# Patient Record
Sex: Male | Born: 1960 | Race: White | Hispanic: No | Marital: Single | Smoking: Never smoker
Health system: Southern US, Community
[De-identification: ages and names within clinical notes are randomized; demographics above are authoritative.]

## PROBLEM LIST (undated history)

## (undated) DIAGNOSIS — J449 Chronic obstructive pulmonary disease, unspecified: Secondary | ICD-10-CM

## (undated) DIAGNOSIS — E119 Type 2 diabetes mellitus without complications: Secondary | ICD-10-CM

## (undated) HISTORY — PX: SHOULDER OPEN ROTATOR CUFF REPAIR: SHX2407

## (undated) HISTORY — PX: REPLACEMENT TOTAL KNEE: SUR1224

---

## 2015-10-09 ENCOUNTER — Emergency Department (HOSPITAL_BASED_OUTPATIENT_CLINIC_OR_DEPARTMENT_OTHER)
Admission: EM | Admit: 2015-10-09 | Discharge: 2015-10-09 | Disposition: A | Discharge status: Home / Self Care | Payer: Medicare Other | Attending: Emergency Medicine | Admitting: Emergency Medicine

## 2015-10-09 ENCOUNTER — Encounter (HOSPITAL_BASED_OUTPATIENT_CLINIC_OR_DEPARTMENT_OTHER): Payer: Self-pay | Admitting: *Deleted

## 2015-10-09 ENCOUNTER — Emergency Department (HOSPITAL_BASED_OUTPATIENT_CLINIC_OR_DEPARTMENT_OTHER): Payer: Medicare Other

## 2015-10-09 DIAGNOSIS — Y9389 Activity, other specified: Secondary | ICD-10-CM | POA: Insufficient documentation

## 2015-10-09 DIAGNOSIS — W3400XA Accidental discharge from unspecified firearms or gun, initial encounter: Secondary | ICD-10-CM | POA: Insufficient documentation

## 2015-10-09 DIAGNOSIS — S61209A Unspecified open wound of unspecified finger without damage to nail, initial encounter: Secondary | ICD-10-CM

## 2015-10-09 DIAGNOSIS — R0981 Nasal congestion: Secondary | ICD-10-CM | POA: Diagnosis present

## 2015-10-09 DIAGNOSIS — H9203 Otalgia, bilateral: Secondary | ICD-10-CM | POA: Diagnosis not present

## 2015-10-09 DIAGNOSIS — Y998 Other external cause status: Secondary | ICD-10-CM | POA: Diagnosis not present

## 2015-10-09 DIAGNOSIS — S61200A Unspecified open wound of right index finger without damage to nail, initial encounter: Secondary | ICD-10-CM | POA: Diagnosis not present

## 2015-10-09 DIAGNOSIS — E119 Type 2 diabetes mellitus without complications: Secondary | ICD-10-CM | POA: Insufficient documentation

## 2015-10-09 DIAGNOSIS — J069 Acute upper respiratory infection, unspecified: Secondary | ICD-10-CM | POA: Diagnosis not present

## 2015-10-09 DIAGNOSIS — Y9289 Other specified places as the place of occurrence of the external cause: Secondary | ICD-10-CM | POA: Insufficient documentation

## 2015-10-09 DIAGNOSIS — E669 Obesity, unspecified: Secondary | ICD-10-CM | POA: Insufficient documentation

## 2015-10-09 HISTORY — DX: Type 2 diabetes mellitus without complications: E11.9

## 2015-10-09 LAB — CBG MONITORING, ED: GLUCOSE-CAPILLARY: 178 mg/dL — AB (ref 65–99)

## 2015-10-09 NOTE — ED Notes (Signed)
Pt refused discharge vitals 

## 2015-10-09 NOTE — ED Notes (Signed)
Patient c/o of bilateral ear pain since yesterday & states he has been swimming for exercise. Also c/o R hand index finger pain resulting from wound on index finger that has not healed
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
## 2015-10-09 NOTE — ED Notes (Signed)
Patient upset that he did not get a prescription for pain medication and refused to let EMT take his BP. Offered referral to hand specialist but refused

## 2021-06-17 ENCOUNTER — Encounter (HOSPITAL_BASED_OUTPATIENT_CLINIC_OR_DEPARTMENT_OTHER): Payer: Self-pay | Admitting: Emergency Medicine

## 2021-06-17 ENCOUNTER — Other Ambulatory Visit: Payer: Self-pay

## 2021-06-17 ENCOUNTER — Emergency Department (HOSPITAL_BASED_OUTPATIENT_CLINIC_OR_DEPARTMENT_OTHER): Payer: Medicare HMO

## 2021-06-17 ENCOUNTER — Emergency Department (HOSPITAL_BASED_OUTPATIENT_CLINIC_OR_DEPARTMENT_OTHER)
Admission: EM | Admit: 2021-06-17 | Discharge: 2021-06-17 | Disposition: A | Discharge status: Home / Self Care | Payer: Medicare HMO | Attending: Emergency Medicine | Admitting: Emergency Medicine

## 2021-06-17 DIAGNOSIS — W450XXA Nail entering through skin, initial encounter: Secondary | ICD-10-CM | POA: Diagnosis not present

## 2021-06-17 DIAGNOSIS — Z96653 Presence of artificial knee joint, bilateral: Secondary | ICD-10-CM | POA: Insufficient documentation

## 2021-06-17 DIAGNOSIS — E119 Type 2 diabetes mellitus without complications: Secondary | ICD-10-CM | POA: Insufficient documentation

## 2021-06-17 DIAGNOSIS — Z79899 Other long term (current) drug therapy: Secondary | ICD-10-CM | POA: Insufficient documentation

## 2021-06-17 DIAGNOSIS — R42 Dizziness and giddiness: Secondary | ICD-10-CM | POA: Insufficient documentation

## 2021-06-17 DIAGNOSIS — R531 Weakness: Secondary | ICD-10-CM | POA: Diagnosis not present

## 2021-06-17 DIAGNOSIS — S91332A Puncture wound without foreign body, left foot, initial encounter: Secondary | ICD-10-CM | POA: Insufficient documentation

## 2021-06-17 DIAGNOSIS — Z7984 Long term (current) use of oral hypoglycemic drugs: Secondary | ICD-10-CM | POA: Insufficient documentation

## 2021-06-17 DIAGNOSIS — S99922A Unspecified injury of left foot, initial encounter: Secondary | ICD-10-CM | POA: Diagnosis present

## 2021-06-17 DIAGNOSIS — Z789 Other specified health status: Secondary | ICD-10-CM

## 2021-06-17 DIAGNOSIS — Z23 Encounter for immunization: Secondary | ICD-10-CM | POA: Insufficient documentation

## 2021-06-17 LAB — CBC WITH DIFFERENTIAL/PLATELET
Abs Immature Granulocytes: 0.05 10*3/uL (ref 0.00–0.07)
Basophils Absolute: 0.1 10*3/uL (ref 0.0–0.1)
Basophils Relative: 1 %
Eosinophils Absolute: 0.3 10*3/uL (ref 0.0–0.5)
Eosinophils Relative: 3 %
HCT: 38.9 % — ABNORMAL LOW (ref 39.0–52.0)
Hemoglobin: 13.3 g/dL (ref 13.0–17.0)
Immature Granulocytes: 1 %
Lymphocytes Relative: 29 %
Lymphs Abs: 2.4 10*3/uL (ref 0.7–4.0)
MCH: 27.8 pg (ref 26.0–34.0)
MCHC: 34.2 g/dL (ref 30.0–36.0)
MCV: 81.2 fL (ref 80.0–100.0)
Monocytes Absolute: 0.7 10*3/uL (ref 0.1–1.0)
Monocytes Relative: 8 %
Neutro Abs: 4.8 10*3/uL (ref 1.7–7.7)
Neutrophils Relative %: 58 %
Platelets: 222 10*3/uL (ref 150–400)
RBC: 4.79 MIL/uL (ref 4.22–5.81)
RDW: 14.6 % (ref 11.5–15.5)
WBC: 8.2 10*3/uL (ref 4.0–10.5)
nRBC: 0 % (ref 0.0–0.2)

## 2021-06-17 LAB — TROPONIN I (HIGH SENSITIVITY): Troponin I (High Sensitivity): 3 ng/L (ref <18)

## 2021-06-17 LAB — URINALYSIS, ROUTINE W REFLEX MICROSCOPIC
Bilirubin Urine: NEGATIVE
Glucose, UA: >=500 mg/dL — AB
Hgb urine dipstick: NEGATIVE
Ketones, ur: NEGATIVE mg/dL
Leukocytes,Ua: NEGATIVE
Nitrite: NEGATIVE
Protein, ur: NEGATIVE mg/dL
Specific Gravity, Urine: 1.025 (ref 1.005–1.030)
pH: 5 (ref 5.0–8.0)

## 2021-06-17 LAB — URINALYSIS, MICROSCOPIC (REFLEX)

## 2021-06-17 LAB — BASIC METABOLIC PANEL
Anion gap: 10 (ref 5–15)
BUN: 15 mg/dL (ref 6–20)
CO2: 25 mmol/L (ref 22–32)
Calcium: 9.1 mg/dL (ref 8.9–10.3)
Chloride: 101 mmol/L (ref 98–111)
Creatinine, Ser: 0.91 mg/dL (ref 0.61–1.24)
GFR, Estimated: >60 mL/min (ref >60)
Glucose, Bld: 238 mg/dL — ABNORMAL HIGH (ref 70–99)
Potassium: 4 mmol/L (ref 3.5–5.1)
Sodium: 136 mmol/L (ref 135–145)

## 2021-06-17 MED ORDER — TETANUS-DIPHTH-ACELL PERTUSSIS 5-2.5-18.5 LF-MCG/0.5 IM SUSY
0.5000 mL | PREFILLED_SYRINGE | Freq: Once | INTRAMUSCULAR | Status: AC
  Administered 2021-06-17: 0.5 mL via INTRAMUSCULAR
  Filled 2021-06-17: qty 0.5

## 2021-06-17 MED ORDER — AMOXICILLIN-POT CLAVULANATE 875-125 MG PO TABS: 1.0000 | ORAL_TABLET | Freq: Two times a day (BID) | ORAL | 0 refills | Status: DC

## 2021-06-17 NOTE — ED Notes (Signed)
In radiology

## 2021-06-17 NOTE — ED Provider Notes (Signed)
[facility] EMERGENCY DEPARTMENT Provider Note   CSN: [id number] Arrival date & time: 06/17/21  4970     History Chief Complaint  Patient presents with   Fatigue    Brett Mcconnell is a 60 y.o. male.  He tells me he stepped on a nail through his shoe into the heel of his left foot yesterday.  His tetanus is out of date.  He also scraped his right calf on the trailer hitch 5 days ago.  He said he feels Generally weak and dizzy right now.  No fevers or chills no chest pain or shortness of breath no abdominal pain vomiting diarrhea or urinary symptoms.  He is a diabetic.  The history is provided by the patient.  Dizziness Quality:  Lightheadedness Severity:  Moderate Onset quality:  Gradual Duration:  2 days Timing:  Intermittent Progression:  Unchanged Chronicity:  New Context: not with loss of consciousness   Relieved by:  None tried Worsened by:  Nothing Ineffective treatments:  None tried Associated symptoms: no chest pain, no diarrhea, no headaches, no nausea, no shortness of breath, no syncope and no vomiting       Past Medical History:  Diagnosis Date   Diabetes mellitus without complication (HCC)     There are no problems to display for this patient.   Past Surgical History:  Procedure Laterality Date   REPLACEMENT TOTAL KNEE Bilateral    SHOULDER OPEN ROTATOR CUFF REPAIR Bilateral        History reviewed. No pertinent family history.  Social History   Tobacco Use   Smoking status: Never   Smokeless tobacco: Never  Substance Use Topics   Alcohol use: No   Drug use: No    Home Medications Prior to Admission medications   Medication Sig Start Date End Date Taking? Authorizing Provider  Atorvastatin Calcium (LIPITOR PO) Take by mouth.   Yes [provider]  LISINOPRIL PO Take by mouth.   Yes [provider]  METFORMIN HCL PO Take by mouth.   Yes [provider]  Pravastatin Sodium (PRAVACHOL PO) Take by mouth.    Yes [provider]  MELOXICAM PO Take by mouth.    [provider]  OMEPRAZOLE PO Take by mouth.    [provider]    Allergies    Patient has no known allergies.  Review of Systems   Review of Systems  Constitutional:  Positive for fatigue. Negative for fever.  HENT:  Negative for sore throat.   Eyes:  Negative for visual disturbance.  Respiratory:  Negative for shortness of breath.   Cardiovascular:  Negative for chest pain and syncope.  Gastrointestinal:  Negative for abdominal pain, diarrhea, nausea and vomiting.  Genitourinary:  Negative for dysuria.  Musculoskeletal:  Negative for neck pain.  Skin:  Positive for wound. Negative for rash.  Neurological:  Positive for dizziness. Negative for headaches.   Physical Exam Updated Vital Signs BP 119/64   Pulse 74   Temp 98.1 F (36.7 C) (Oral)   Resp 18   Ht 5\' 10"  (1.778 m)   Wt 124.7 kg   SpO2 97%   BMI 39.46 kg/m   Physical Exam Vitals and nursing note reviewed.  Constitutional:      Appearance: Normal appearance. He is well-developed. He is obese.  HENT:     Head: Normocephalic and atraumatic.  Eyes:     Conjunctiva/sclera: Conjunctivae normal.  Cardiovascular:     Rate and Rhythm: Normal rate and regular  rhythm.     Heart sounds: No murmur heard. Pulmonary:     Effort: Pulmonary effort is normal. No respiratory distress.     Breath sounds: Normal breath sounds.  Abdominal:     Palpations: Abdomen is soft.     Tenderness: There is no abdominal tenderness.  Musculoskeletal:        General: Tenderness and signs of injury present. Normal range of motion.     Cervical back: Neck supple.     Comments: He has a small puncture wound to his left heel.  Its mildly tender but there is no fluctuance or erythema.  He also has approximately 0.5 cm skin avulsion on the lateral aspect of his right calf.  There is minimal surrounding erythema.  Skin:    General: Skin is warm and dry.   Neurological:     General: No focal deficit present.     Mental Status: He is alert.     Gait: Gait normal.    ED Results / Procedures / Treatments   Labs (all labs ordered are listed, but only abnormal results are displayed) Labs Reviewed  BASIC METABOLIC PANEL - Abnormal; Notable for the following components:      Result Value   Glucose, Bld 238 (*)    All other components within normal limits  CBC WITH DIFFERENTIAL/PLATELET - Abnormal; Notable for the following components:   HCT 38.9 (*)    All other components within normal limits  URINALYSIS, ROUTINE W REFLEX MICROSCOPIC - Abnormal; Notable for the following components:   Glucose, UA >=500 (*)    All other components within normal limits  URINALYSIS, MICROSCOPIC (REFLEX) - Abnormal; Notable for the following components:   Bacteria, UA RARE (*)    All other components within normal limits  TROPONIN I (HIGH SENSITIVITY)    EKG None  Radiology No results found.  Procedures Procedures   Medications Ordered in ED Medications  Tdap (BOOSTRIX) injection 0.5 mL (0.5 mLs Intramuscular Given 06/17/21 1230)    ED Course  I have reviewed the triage vital signs and the nursing notes.  Pertinent labs & imaging results that were available during my care of the patient were reviewed by me and considered in my medical decision making (see chart for details).  Clinical Course as of 06/17/21 1734  Fri Jun 17, 2021  1132 Xray left foot does not show any obvious foreign body. [MB]  1149 EKG showing normal sinus rhythm low voltage no acute ST-T changes.  Rate of 72 normal intervals. [MB]    Clinical Course User Index [MB] Terrilee Files, MD   MDM Rules/Calculators/A&P                          This patient complains of puncture wound to her left foot, wound to right lower leg, generalized malaise and dizziness; this involves an extensive number of treatment Options and is a complaint that carries with it a high risk of  complications and Morbidity. The differential includes infection, dehydration, anemia, hyperglycemia, UTI, ACS  I ordered, reviewed and interpreted labs, which included CBC with normal white count, hemoglobin stable from priors, chemistries fairly normal other than mildly elevated glucose of 238, troponin unremarkable, urinalysis unremarkable other than spilling sugar I ordered medication tetanus update I ordered imaging studies which included left foot x-ray and I independently    visualized and interpreted imaging which showed no foreign body identified Previous records obtained and reviewed in epic,  no recent admissions  After the interventions stated above, I reevaluated the patient and found patient to be otherwise hemodynamically stable.  He is comfortable plan for antibiotics to help decrease the risk of foot infection and close follow-up with his treating providers.  Return instructions discussed   Final Clinical Impression(s) / ED Diagnoses Final diagnoses:  Dizziness  Puncture wound of left foot, initial encounter  Not up to date with diphtheria-tetanus vaccination    Rx / DC Orders ED Discharge Orders          Ordered    amoxicillin-clavulanate (AUGMENTIN) 875-125 MG tablet  Every 12 hours        06/17/21 1241             Terrilee Files, MD 06/17/21 1737

## 2021-06-17 NOTE — Discharge Instructions (Addendum)
You are seen in the emergency department for evaluation of a puncture wound to your left foot, a wound to your right leg and feeling dizzy.  You had blood work done that did not show any significant abnormalities and your x-ray did not show any obvious foreign body.  We are putting you on some antibiotics for the puncture wound and we updated your tetanus.  Please soak your foot in warm salt water daily and watch for signs of infection.  Follow-up with your doctor.  Return to the emergency department if any worsening or concerning symptoms

## 2021-06-17 NOTE — ED Triage Notes (Signed)
Pt reports "just don't feel right" since cutting right calf on trailer and stepped on nail and reports puncture to left foot. Pt reports is diabetic and tetanus has expired.

## 2021-10-23 ENCOUNTER — Emergency Department (HOSPITAL_BASED_OUTPATIENT_CLINIC_OR_DEPARTMENT_OTHER): Payer: Commercial Managed Care - HMO

## 2021-10-23 ENCOUNTER — Encounter (HOSPITAL_BASED_OUTPATIENT_CLINIC_OR_DEPARTMENT_OTHER): Payer: Self-pay

## 2021-10-23 ENCOUNTER — Emergency Department (HOSPITAL_BASED_OUTPATIENT_CLINIC_OR_DEPARTMENT_OTHER)
Admission: EM | Admit: 2021-10-23 | Discharge: 2021-10-23 | Disposition: A | Discharge status: Home / Self Care | Payer: Commercial Managed Care - HMO | Attending: Emergency Medicine | Admitting: Emergency Medicine

## 2021-10-23 ENCOUNTER — Other Ambulatory Visit: Payer: Self-pay

## 2021-10-23 DIAGNOSIS — Z96653 Presence of artificial knee joint, bilateral: Secondary | ICD-10-CM | POA: Insufficient documentation

## 2021-10-23 DIAGNOSIS — Z20822 Contact with and (suspected) exposure to covid-19: Secondary | ICD-10-CM | POA: Insufficient documentation

## 2021-10-23 DIAGNOSIS — Z7984 Long term (current) use of oral hypoglycemic drugs: Secondary | ICD-10-CM | POA: Diagnosis not present

## 2021-10-23 DIAGNOSIS — J209 Acute bronchitis, unspecified: Secondary | ICD-10-CM | POA: Insufficient documentation

## 2021-10-23 DIAGNOSIS — E119 Type 2 diabetes mellitus without complications: Secondary | ICD-10-CM | POA: Insufficient documentation

## 2021-10-23 DIAGNOSIS — R0981 Nasal congestion: Secondary | ICD-10-CM | POA: Diagnosis present

## 2021-10-23 LAB — BASIC METABOLIC PANEL
Anion gap: 7 (ref 5–15)
BUN: 13 mg/dL (ref 8–23)
CO2: 24 mmol/L (ref 22–32)
Calcium: 8.6 mg/dL — ABNORMAL LOW (ref 8.9–10.3)
Chloride: 102 mmol/L (ref 98–111)
Creatinine, Ser: 0.91 mg/dL (ref 0.61–1.24)
GFR, Estimated: >60 mL/min (ref >60)
Glucose, Bld: 160 mg/dL — ABNORMAL HIGH (ref 70–99)
Potassium: 3.9 mmol/L (ref 3.5–5.1)
Sodium: 133 mmol/L — ABNORMAL LOW (ref 135–145)

## 2021-10-23 LAB — CBC
HCT: 40.1 % (ref 39.0–52.0)
Hemoglobin: 13.3 g/dL (ref 13.0–17.0)
MCH: 27.3 pg (ref 26.0–34.0)
MCHC: 33.2 g/dL (ref 30.0–36.0)
MCV: 82.2 fL (ref 80.0–100.0)
Platelets: 253 10*3/uL (ref 150–400)
RBC: 4.88 MIL/uL (ref 4.22–5.81)
RDW: 14.7 % (ref 11.5–15.5)
WBC: 7.7 10*3/uL (ref 4.0–10.5)
nRBC: 0 % (ref 0.0–0.2)

## 2021-10-23 LAB — RESP PANEL BY RT-PCR (FLU A&B, COVID) ARPGX2
Influenza A by PCR: NEGATIVE
Influenza B by PCR: NEGATIVE
SARS Coronavirus 2 by RT PCR: NEGATIVE

## 2021-10-23 MED ORDER — HYDROCORTISONE 2.5 % EX CREA: TOPICAL_CREAM | Freq: Two times a day (BID) | CUTANEOUS | 0 refills | Status: AC

## 2021-10-23 MED ORDER — AZITHROMYCIN 250 MG PO TABS: 250.0000 mg | ORAL_TABLET | Freq: Every day | ORAL | 0 refills | Status: AC

## 2021-10-23 MED ORDER — HYDROCODONE BIT-HOMATROP MBR 5-1.5 MG/5ML PO SOLN: 5.0000 mL | Freq: Four times a day (QID) | ORAL | 0 refills | Status: AC | PRN

## 2021-10-23 NOTE — ED Notes (Signed)
ED Provider at bedside. 

## 2021-10-23 NOTE — ED Triage Notes (Signed)
Pt c/o flu sx x 3 days-scattered rash x 2 week-NAD-steady gait

## 2021-10-23 NOTE — ED Notes (Signed)
XR at bedside

## 2021-10-23 NOTE — Discharge Instructions (Addendum)
Your blood work today was reassuring.  Chest x-ray does show some atelectasis.  I will treat this with antibiotics.  Also given you cough syrup for severe coughing spells you are having.  Continue taking Mucinex as discussed.  If your symptoms worsen return to the emergency room.  Otherwise I recommend you follow-up with your primary care provider and also have your rash evaluated.

## 2021-10-23 NOTE — ED Provider Notes (Signed)
[facility] EMERGENCY DEPARTMENT Provider Note   CSN: [id number] Arrival date & time: 10/23/21  1534     History Chief Complaint  Patient presents with   Cough    Brett Mcconnell is a 60 y.o. male.  60 year old male presents today for evaluation of 3-day duration of chills, congestion, productive cough.  Reports he is having significant coughing spells that leave him winded and produce chest discomfort.  He denies chest pain at rest or outside of his coughing spells.  Wife is at bedside who reports patient has not had a temperature of greater than 99.0.  He denies nausea, vomiting, abdominal pain.  He has tried over-the-counter medicine with mild relief.  He also reports a rash which has been present for 2 weeks they have tried over-the-counter medicines without relief.  The history is provided by the patient. No language interpreter was used.      Past Medical History:  Diagnosis Date   Diabetes mellitus without complication ([place])     There are no problems to display for this patient.   Past Surgical History:  Procedure Laterality Date   REPLACEMENT TOTAL KNEE Bilateral    SHOULDER OPEN ROTATOR CUFF REPAIR Bilateral        No family history on file.  Social History   Tobacco Use   Smoking status: Never   Smokeless tobacco: Never  Vaping Use   Vaping Use: Never used  Substance Use Topics   Alcohol use: No   Drug use: No    Home Medications Prior to Admission medications   Medication Sig Start Date End Date Taking? Authorizing Provider  amoxicillin-clavulanate (AUGMENTIN) 875-125 MG tablet Take 1 tablet by mouth every 12 (twelve) hours. 06/17/21   Hayden Rasmussen, MD  Atorvastatin Calcium (LIPITOR PO) Take by mouth.    [provider]  LISINOPRIL PO Take by mouth.    [provider]  MELOXICAM PO Take by mouth.    [provider]  METFORMIN HCL PO Take by mouth.    [provider]  OMEPRAZOLE PO Take by mouth.     [provider]  Pravastatin Sodium (PRAVACHOL PO) Take by mouth.    [provider]    Allergies    Patient has no known allergies.  Review of Systems   Review of Systems  Constitutional:  Negative for appetite change, chills and fever.  HENT:  Positive for congestion.   Respiratory:  Positive for cough. Negative for shortness of breath.   Cardiovascular:  Positive for chest pain (Only with coughing). Negative for palpitations.  Gastrointestinal:  Negative for abdominal pain, nausea and vomiting.  Skin:  Positive for rash.  Neurological:  Negative for weakness and numbness.  All other systems reviewed and are negative.  Physical Exam Updated Vital Signs BP (!) 141/73 (BP Location: Left Arm)    Pulse 75    Temp 98.8 F (37.1 C) (Oral)    Resp 20    Ht 5\' 10"  (1.778 m)    Wt 133.4 kg    SpO2 96%    BMI 42.18 kg/m   Physical Exam Vitals and nursing note reviewed.  Constitutional:      General: He is not in acute distress.    Appearance: Normal appearance. He is not ill-appearing.  HENT:     Head: Normocephalic and atraumatic.     Nose: Nose normal.  Eyes:     General: No scleral icterus.    Extraocular Movements: Extraocular movements intact.  Conjunctiva/sclera: Conjunctivae normal.  Cardiovascular:     Rate and Rhythm: Normal rate and regular rhythm.     Pulses: Normal pulses.     Heart sounds: Normal heart sounds.  Pulmonary:     Effort: Pulmonary effort is normal. No respiratory distress.     Breath sounds: No wheezing or rales.     Comments: Coarse lung sounds Abdominal:     General: There is no distension.     Tenderness: There is no abdominal tenderness.  Musculoskeletal:        General: Normal range of motion.     Cervical back: Normal range of motion.     Right lower leg: No edema.     Left lower leg: No edema.  Skin:    General: Skin is warm and dry.     Findings: Rash present.  Neurological:     General: No focal deficit present.      Mental Status: He is alert. Mental status is at baseline.    ED Results / Procedures / Treatments   Labs (all labs ordered are listed, but only abnormal results are displayed) Labs Reviewed  RESP PANEL BY RT-PCR (FLU A&B, COVID) [id number]  CBC  BASIC METABOLIC PANEL    EKG None  Radiology No results found.  Procedures Procedures   Medications Ordered in ED Medications - No data to display  ED Course  I have reviewed the triage vital signs and the nursing notes.  Pertinent labs & imaging results that were available during my care of the patient were reviewed by me and considered in my medical decision making (see chart for details).    MDM Rules/Calculators/A&P                         60 year old male presents with his wife for evaluation of URI symptoms x3 days with persistent cough that is now producing chest wall.  Patient denies chest pain at rest, or previous cardiac history with the exception of hypertension.  He is without shortness of breath at rest or exertion.  Rash has not concern for vasculitis.  It is pruritic.  We will provide hydrocortisone cream.  Discussed importance of follow-up with his primary care provider to have this reevaluated.  Respiratory panel negative for COVID and flu.  CBC is unremarkable.  BMP with sodium of 133, glucose 160 otherwise without significant findings.  Chest x-ray shows right lower lung atelectasis.  We will treat patient with Z-Pak as well as cough syrup given this significant episodes of coughing spells that also resulted in chest wall pain.  Patient is appropriate for discharge.  Return precautions discussed.  Discussed importance of follow-up with primary care provider.  Patient voices understanding and is in agreement with plan.    Final Clinical Impression(s) / ED Diagnoses Final diagnoses:  Acute bronchitis, unspecified organism    Rx / DC Orders ED Discharge Orders          Ordered    azithromycin (ZITHROMAX) 250 MG  tablet  Daily        10/23/21 1701    HYDROcodone bit-homatropine (HYCODAN) 5-1.5 MG/5ML syrup  Every 6 hours PRN        10/23/21 1701    hydrocortisone 2.5 % cream  2 times daily        10/23/21 1701             Marita [place], PA-C 10/23/21 1705    Alvira Monday, MD 10/26/21  1701 ° °

## 2022-01-17 ENCOUNTER — Emergency Department (HOSPITAL_BASED_OUTPATIENT_CLINIC_OR_DEPARTMENT_OTHER)
Admission: EM | Admit: 2022-01-17 | Discharge: 2022-01-17 | Disposition: A | Discharge status: Home / Self Care | Payer: Medicare Other | Attending: Emergency Medicine | Admitting: Emergency Medicine

## 2022-01-17 ENCOUNTER — Emergency Department (HOSPITAL_BASED_OUTPATIENT_CLINIC_OR_DEPARTMENT_OTHER): Payer: Medicare Other

## 2022-01-17 ENCOUNTER — Other Ambulatory Visit: Payer: Self-pay

## 2022-01-17 ENCOUNTER — Other Ambulatory Visit (HOSPITAL_BASED_OUTPATIENT_CLINIC_OR_DEPARTMENT_OTHER): Payer: Self-pay

## 2022-01-17 ENCOUNTER — Encounter (HOSPITAL_BASED_OUTPATIENT_CLINIC_OR_DEPARTMENT_OTHER): Payer: Self-pay | Admitting: *Deleted

## 2022-01-17 DIAGNOSIS — A084 Viral intestinal infection, unspecified: Secondary | ICD-10-CM | POA: Diagnosis not present

## 2022-01-17 DIAGNOSIS — Z20822 Contact with and (suspected) exposure to covid-19: Secondary | ICD-10-CM | POA: Diagnosis not present

## 2022-01-17 DIAGNOSIS — R197 Diarrhea, unspecified: Secondary | ICD-10-CM | POA: Insufficient documentation

## 2022-01-17 DIAGNOSIS — R109 Unspecified abdominal pain: Secondary | ICD-10-CM | POA: Diagnosis present

## 2022-01-17 LAB — RESP PANEL BY RT-PCR (FLU A&B, COVID) ARPGX2
Influenza A by PCR: NEGATIVE
Influenza B by PCR: NEGATIVE
SARS Coronavirus 2 by RT PCR: NEGATIVE

## 2022-01-17 LAB — COMPREHENSIVE METABOLIC PANEL
ALT: 32 U/L (ref 0–44)
AST: 29 U/L (ref 15–41)
Albumin: 3.8 g/dL (ref 3.5–5.0)
Alkaline Phosphatase: 68 U/L (ref 38–126)
Anion gap: 9 (ref 5–15)
BUN: 20 mg/dL (ref 8–23)
CO2: 22 mmol/L (ref 22–32)
Calcium: 8.6 mg/dL — ABNORMAL LOW (ref 8.9–10.3)
Chloride: 103 mmol/L (ref 98–111)
Creatinine, Ser: 0.91 mg/dL (ref 0.61–1.24)
GFR, Estimated: >60 mL/min (ref >60)
Glucose, Bld: 182 mg/dL — ABNORMAL HIGH (ref 70–99)
Potassium: 4.4 mmol/L (ref 3.5–5.1)
Sodium: 134 mmol/L — ABNORMAL LOW (ref 135–145)
Total Bilirubin: 0.8 mg/dL (ref 0.3–1.2)
Total Protein: 7.5 g/dL (ref 6.5–8.1)

## 2022-01-17 LAB — CBC WITH DIFFERENTIAL/PLATELET
Abs Immature Granulocytes: 0.03 10*3/uL (ref 0.00–0.07)
Basophils Absolute: 0 10*3/uL (ref 0.0–0.1)
Basophils Relative: 0 %
Eosinophils Absolute: 0.3 10*3/uL (ref 0.0–0.5)
Eosinophils Relative: 3 %
HCT: 45.3 % (ref 39.0–52.0)
Hemoglobin: 15.1 g/dL (ref 13.0–17.0)
Immature Granulocytes: 0 %
Lymphocytes Relative: 7 %
Lymphs Abs: 0.7 10*3/uL (ref 0.7–4.0)
MCH: 27.5 pg (ref 26.0–34.0)
MCHC: 33.3 g/dL (ref 30.0–36.0)
MCV: 82.5 fL (ref 80.0–100.0)
Monocytes Absolute: 0.6 10*3/uL (ref 0.1–1.0)
Monocytes Relative: 6 %
Neutro Abs: 8.4 10*3/uL — ABNORMAL HIGH (ref 1.7–7.7)
Neutrophils Relative %: 84 %
Platelets: 257 10*3/uL (ref 150–400)
RBC: 5.49 MIL/uL (ref 4.22–5.81)
RDW: 14.6 % (ref 11.5–15.5)
WBC: 9.9 10*3/uL (ref 4.0–10.5)
nRBC: 0 % (ref 0.0–0.2)

## 2022-01-17 LAB — LIPASE, BLOOD: Lipase: 37 U/L (ref 11–51)

## 2022-01-17 LAB — URINALYSIS, ROUTINE W REFLEX MICROSCOPIC
Bilirubin Urine: NEGATIVE
Glucose, UA: NEGATIVE mg/dL
Hgb urine dipstick: NEGATIVE
Ketones, ur: NEGATIVE mg/dL
Leukocytes,Ua: NEGATIVE
Nitrite: NEGATIVE
Protein, ur: NEGATIVE mg/dL
Specific Gravity, Urine: >=1.03 (ref 1.005–1.030)
pH: 5 (ref 5.0–8.0)

## 2022-01-17 LAB — CBG MONITORING, ED: Glucose-Capillary: 175 mg/dL — ABNORMAL HIGH (ref 70–99)

## 2022-01-17 MED ORDER — ONDANSETRON HCL 4 MG PO TABS
4.0000 mg | ORAL_TABLET | Freq: Four times a day (QID) | ORAL | 0 refills | Status: AC
  Filled 2022-01-17: qty 12, 3d supply, fill #0

## 2022-01-17 MED ORDER — SODIUM CHLORIDE 0.9 % IV BOLUS
1000.0000 mL | Freq: Once | INTRAVENOUS | Status: AC
Start: 2022-01-17 — End: 2022-01-17
  Administered 2022-01-17: 1000 mL via INTRAVENOUS

## 2022-01-17 MED ORDER — IOHEXOL 300 MG/ML  SOLN
125.0000 mL | Freq: Once | INTRAMUSCULAR | Status: AC | PRN
  Administered 2022-01-17: 125 mL via INTRAVENOUS

## 2022-01-17 MED ORDER — ONDANSETRON HCL 4 MG/2ML IJ SOLN
4.0000 mg | Freq: Once | INTRAMUSCULAR | Status: AC
  Administered 2022-01-17: 4 mg via INTRAVENOUS
  Filled 2022-01-17: qty 2

## 2022-01-17 MED ORDER — AMOXICILLIN-POT CLAVULANATE 875-125 MG PO TABS
1.0000 | ORAL_TABLET | Freq: Two times a day (BID) | ORAL | 0 refills | Status: AC
  Filled 2022-01-17: qty 14, 7d supply, fill #0

## 2022-01-17 MED ORDER — ACETAMINOPHEN 325 MG PO TABS
650.0000 mg | ORAL_TABLET | Freq: Once | ORAL | Status: AC
  Administered 2022-01-17: 650 mg via ORAL
  Filled 2022-01-17: qty 2

## 2022-01-17 NOTE — ED Provider Notes (Signed)
?[facility] EMERGENCY DEPARTMENT ?Provider Note ? ? ?CSN: [id number] ?Arrival date & time: 01/17/22  1328 ? ?  ? ?History ? ?Chief Complaint  ?Patient presents with  ? Emesis  ? Diarrhea  ? ? ?Brett Mcconnell is a 61 y.o. male. ? ?The history is provided by the patient.  ?Emesis ?Severity:  Mild ?Duration:  1 day ?Timing:  Constant ?Progression:  Unchanged ?Chronicity:  New ?Relieved by:  Nothing ?Worsened by:  Nothing ?Associated symptoms: diarrhea   ?Associated symptoms: no abdominal pain, no arthralgias, no chills, no cough, no fever, no headaches, no myalgias, no sore throat and no URI   ?Risk factors: suspect food intake   ?Risk factors: no prior abdominal surgery   ?Diarrhea ?Associated symptoms: vomiting   ?Associated symptoms: no abdominal pain, no arthralgias, no chills, no fever, no headaches, no myalgias and no URI   ? ?  ? ?Home Medications ?Prior to Admission medications   ?Medication Sig Start Date End Date Taking? Authorizing Provider  ?amoxicillin-clavulanate (AUGMENTIN) 875-125 MG tablet Take 1 tablet by mouth every 12 (twelve) hours. 01/17/22  Yes Joliet Mallozzi, DO  ?ondansetron (ZOFRAN) 4 MG tablet Take 1 tablet (4 mg total) by mouth every 6 (six) hours. 01/17/22  Yes Virgina Norfolk, DO  ?Atorvastatin Calcium (LIPITOR PO) Take by mouth.    [provider]  ?azithromycin (ZITHROMAX) 250 MG tablet Take 1 tablet (250 mg total) by mouth daily. Take first 2 tablets together, then 1 every day until finished. 10/23/21   Marita Kansas, PA-C  ?HYDROcodone bit-homatropine (HYCODAN) 5-1.5 MG/5ML syrup Take 5 mLs by mouth every 6 (six) hours as needed for cough. 10/23/21   Marita Kansas, PA-C  ?hydrocortisone 2.5 % cream Apply topically 2 (two) times daily. 10/23/21   Marita Kansas, PA-C  ?LISINOPRIL PO Take by mouth.    [provider]  ?MELOXICAM PO Take by mouth.    [provider]  ?METFORMIN HCL PO Take by mouth.    [provider]  ?OMEPRAZOLE PO Take by mouth.     [provider]  ?Pravastatin Sodium (PRAVACHOL PO) Take by mouth.    [provider]  ?   ? ?Allergies    ?Patient has no known allergies.   ? ?Review of Systems   ?Review of Systems  ?Constitutional:  Negative for chills and fever.  ?HENT:  Negative for sore throat.   ?Respiratory:  Negative for cough.   ?Gastrointestinal:  Positive for diarrhea and vomiting. Negative for abdominal pain.  ?Musculoskeletal:  Negative for arthralgias and myalgias.  ?Neurological:  Negative for headaches.  ? ?Physical Exam ?Updated Vital Signs ?BP 112/62   Pulse (!) 101   Temp 98.7 ?F (37.1 ?C) (Oral)   Resp 18   Ht 5\' 10"  (1.778 m)   Wt 133.4 kg   SpO2 93%   BMI 42.20 kg/m?  ?Physical Exam ?Vitals and nursing note reviewed.  ?Constitutional:   ?   General: He is not in acute distress. ?   Appearance: He is well-developed. He is not ill-appearing.  ?HENT:  ?   Head: Normocephalic and atraumatic.  ?   Nose: Nose normal.  ?   Mouth/Throat:  ?   Mouth: Mucous membranes are moist.  ?Eyes:  ?   Extraocular Movements: Extraocular movements intact.  ?   Conjunctiva/sclera: Conjunctivae normal.  ?   Pupils: Pupils are equal, round, and reactive to light.  ?Cardiovascular:  ?   Rate and Rhythm: Normal rate and regular rhythm.  ?  Pulses: Normal pulses.  ?   Heart sounds: Normal heart sounds. No murmur heard. ?Pulmonary:  ?   Effort: Pulmonary effort is normal. No respiratory distress.  ?   Breath sounds: Normal breath sounds.  ?Abdominal:  ?   Palpations: Abdomen is soft.  ?   Tenderness: There is abdominal tenderness.  ?Musculoskeletal:     ?   General: No swelling. Normal range of motion.  ?   Cervical back: Normal range of motion and neck supple.  ?Skin: ?   General: Skin is warm and dry.  ?   Capillary Refill: Capillary refill takes less than 2 seconds.  ?Neurological:  ?   General: No focal deficit present.  ?   Mental Status: He is alert.  ?Psychiatric:     ?   Mood and Affect: Mood normal.  ? ? ?ED Results /  Procedures / Treatments   ?Labs ?(all labs ordered are listed, but only abnormal results are displayed) ?Labs Reviewed  ?CBC WITH DIFFERENTIAL/PLATELET - Abnormal; Notable for the following components:  ?    Result Value  ? Neutro Abs 8.4 (*)   ? All other components within normal limits  ?COMPREHENSIVE METABOLIC PANEL - Abnormal; Notable for the following components:  ? Sodium 134 (*)   ? Glucose, Bld 182 (*)   ? Calcium 8.6 (*)   ? All other components within normal limits  ?CBG MONITORING, ED - Abnormal; Notable for the following components:  ? Glucose-Capillary 175 (*)   ? All other components within normal limits  ?RESP PANEL BY RT-PCR (FLU A&B, COVID) [id number]  ?LIPASE, BLOOD  ?URINALYSIS, ROUTINE W REFLEX MICROSCOPIC  ?CBG MONITORING, ED  ? ? ?EKG ?None ? ?Radiology ?CT ABDOMEN PELVIS W CONTRAST ? ?Result Date: 01/17/2022 ?CLINICAL DATA:  Concern for colitis, emesis. EXAM: CT ABDOMEN AND PELVIS WITH CONTRAST TECHNIQUE: Multidetector CT imaging of the abdomen and pelvis was performed using the standard protocol following bolus administration of intravenous contrast. RADIATION DOSE REDUCTION: This exam was performed according to the departmental dose-optimization program which includes automated exposure control, adjustment of the mA and/or kV according to patient size and/or use of iterative reconstruction technique. CONTRAST:  OMNIPAQUE IOHEXOL 300 MG/ML  SOLN COMPARISON:  CT May 20, 2020 FINDINGS: Lower chest: No acute abnormality. Hepatobiliary: Hepatomegaly with hepatic steatosis. No suspicious hepatic lesion. Gallbladder is unremarkable Pancreas: No pancreatic ductal dilation or evidence of acute inflammation. Spleen: No splenomegaly or focal splenic lesion. Adrenals/Urinary Tract: Bilateral adrenal glands appear normal. No hydronephrosis. Nonobstructive 2-3 mm right lower pole renal stone. Hypodense exophytic 4.2 cm left lower pole renal cyst. Additional hypodense subcentimeter renal lesions are  technically too small to accurately characterize but statistically likely reflect cysts. Subtle delayed enhancement of the right kidney may reflect pyelonephritis. Urinary bladder is unremarkable for degree of distension. Stomach/Bowel: No enteric contrast was administered. Stomach is unremarkable for degree of distension. No pathologic dilation of small or large bowel. The terminal ileum appears normal. Appendix is not confidently identified however there is no pericecal inflammation. Colonic diverticulosis without findings of acute diverticulitis. Vascular/Lymphatic: Aortic and branch vessel atherosclerosis without abdominal aortic aneurysm. No pathologically enlarged abdominal or pelvic lymph nodes. Prominent periportal lymph nodes are favored reactive. Reproductive: Prostate is unremarkable. Other: No significant abdominopelvic free fluid. Misty appearance of the small bowel mesentery with prominent mesenteric lymph nodes measuring up to 5 mm. Musculoskeletal: Multilevel degenerative changes spine. No acute osseous abnormality. IMPRESSION: 1. Subtle delayed enhancement of the right kidney may reflect  pyelonephritis. Correlation with urinalysis recommended. 2. Hepatomegaly with hepatic steatosis. 3. Colonic diverticulosis without findings of acute diverticulitis. 4. Nonobstructive right nephrolithiasis. 5. Misty appearance of the small bowel mesentery with prominent mesenteric lymph nodes measuring up to 5 mm. Findings are nonspecific but can be seen in the setting of mesenteric panniculitis. 6.  Aortic Atherosclerosis (ICD10-I70.0). Electronically Signed   By: Maudry MayhewJeffrey  Waltz M.D.   On: 01/17/2022 17:30   ? ?Procedures ?Procedures  ? ? ?Medications Ordered in ED ?Medications  ?sodium chloride 0.9 % bolus 1,000 mL (0 mLs Intravenous Stopped 01/17/22 1621)  ?acetaminophen (TYLENOL) tablet 650 mg (650 mg Oral Given 01/17/22 1517)  ?ondansetron (ZOFRAN) injection 4 mg (4 mg Intravenous Given 01/17/22 1539)  ?iohexol  (OMNIPAQUE) 300 MG/ML solution 125 mL (125 mLs Intravenous Contrast Given 01/17/22 1658)  ? ? ?ED Course/ Medical Decision Making/ A&P ?  ?                        ?Medical Decision Making ?Amount and/or Complexity of Data R

## 2022-01-17 NOTE — Discharge Instructions (Addendum)
Overall suspect likely foodborne illness causing her nausea, vomiting, diarrhea today.  However we will have the start antibiotics.  Take nausea medicine as needed. ?

## 2022-01-17 NOTE — ED Triage Notes (Signed)
States he feels like his blood sugar is low. Vomiting and diarrhea since 3am.

## 2022-08-28 ENCOUNTER — Encounter (HOSPITAL_BASED_OUTPATIENT_CLINIC_OR_DEPARTMENT_OTHER): Payer: Self-pay | Admitting: Emergency Medicine

## 2022-08-28 ENCOUNTER — Emergency Department (HOSPITAL_BASED_OUTPATIENT_CLINIC_OR_DEPARTMENT_OTHER): Payer: Medicare Other

## 2022-08-28 ENCOUNTER — Other Ambulatory Visit: Payer: Self-pay

## 2022-08-28 ENCOUNTER — Emergency Department (HOSPITAL_BASED_OUTPATIENT_CLINIC_OR_DEPARTMENT_OTHER)
Admission: EM | Admit: 2022-08-28 | Discharge: 2022-08-29 | Disposition: A | Discharge status: Home / Self Care | Payer: Medicare Other | Attending: Emergency Medicine | Admitting: Emergency Medicine

## 2022-08-28 DIAGNOSIS — S59901A Unspecified injury of right elbow, initial encounter: Secondary | ICD-10-CM | POA: Diagnosis present

## 2022-08-28 DIAGNOSIS — S5001XA Contusion of right elbow, initial encounter: Secondary | ICD-10-CM | POA: Insufficient documentation

## 2022-08-28 DIAGNOSIS — E119 Type 2 diabetes mellitus without complications: Secondary | ICD-10-CM | POA: Diagnosis not present

## 2022-08-28 DIAGNOSIS — W109XXA Fall (on) (from) unspecified stairs and steps, initial encounter: Secondary | ICD-10-CM | POA: Diagnosis not present

## 2022-08-28 HISTORY — DX: Chronic obstructive pulmonary disease, unspecified: J44.9

## 2022-08-28 NOTE — ED Provider Notes (Signed)
[facility] EMERGENCY DEPARTMENT Provider Note   CSN: [id number] Arrival date & time: 08/28/22  2007     History {Add pertinent medical, surgical, social history, OB history to HPI:1} No chief complaint on file.   Brett Mcconnell is a 61 y.o. male.  The history is provided by the patient.  Brett Mcconnell is a 61 y.o. male who presents to the Emergency Department complaining of elbow injury.  On Saturday he was walking up the steps and caught on a vine and he fell up the stairs and landed directly on his flexed right elbow.  His pain has progressively worsened since the fall.  He is right hand dominant.       Home Medications Prior to Admission medications   Medication Sig Start Date End Date Taking? Authorizing Provider  amoxicillin-clavulanate (AUGMENTIN) 875-125 MG tablet Take 1 tablet by mouth every 12 (twelve) hours. 01/17/22   Curatolo, Adam, DO  Atorvastatin Calcium (LIPITOR PO) Take by mouth.    [provider]  azithromycin (ZITHROMAX) 250 MG tablet Take 1 tablet (250 mg total) by mouth daily. Take first 2 tablets together, then 1 every day until finished. 10/23/21   Karie Mainland, Amjad, PA-C  HYDROcodone bit-homatropine (HYCODAN) 5-1.5 MG/5ML syrup Take 5 mLs by mouth every 6 (six) hours as needed for cough. 10/23/21   Marita Kansas, PA-C  hydrocortisone 2.5 % cream Apply topically 2 (two) times daily. 10/23/21   Karie Mainland, Amjad, PA-C  LISINOPRIL PO Take by mouth.    [provider]  MELOXICAM PO Take by mouth.    [provider]  METFORMIN HCL PO Take by mouth.    [provider]  OMEPRAZOLE PO Take by mouth.    [provider]  ondansetron (ZOFRAN) 4 MG tablet Take 1 tablet (4 mg total) by mouth every 6 (six) hours. 01/17/22   Curatolo, Adam, DO  Pravastatin Sodium (PRAVACHOL PO) Take by mouth.    [provider]      Allergies    Patient has no known allergies.    Review of Systems   Review of Systems  All other systems  reviewed and are negative.   Physical Exam Updated Vital Signs BP (!) 142/74 (BP Location: Left Arm)   Pulse 77   Temp 98.3 F (36.8 C) (Oral)   Resp 20   Wt 129.3 kg   SpO2 96%   BMI 40.89 kg/m  Physical Exam Vitals and nursing note reviewed.  Constitutional:      Appearance: He is well-developed.  HENT:     Head: Normocephalic and atraumatic.  Cardiovascular:     Rate and Rhythm: Normal rate and regular rhythm.  Pulmonary:     Effort: Pulmonary effort is normal. No respiratory distress.  Abdominal:     Palpations: Abdomen is soft.     Tenderness: There is no abdominal tenderness. There is no guarding or rebound.  Musculoskeletal:        General: Swelling present. No tenderness.     Comments: Soft tissue swelling and tenderness to the right elbow with inability to fully extend at the elbow or fully supinate/pronate.  ROM intact  at the wrist and digits.  2+ radial pulses bilaterally.    Skin:    General: Skin is warm and dry.  Neurological:     Mental Status: He is alert and oriented to person, place, and time.     Comments: 5/5 grip strength in the right hand.    Psychiatric:  Behavior: Behavior normal.     ED Results / Procedures / Treatments   Labs (all labs ordered are listed, but only abnormal results are displayed) Labs Reviewed - No data to display  EKG None  Radiology DG Elbow Complete Right  Result Date: 08/28/2022 CLINICAL DATA:  Fall. EXAM: RIGHT ELBOW - COMPLETE 3+ VIEW COMPARISON:  None Available. FINDINGS: There is medial soft tissue swelling and joint effusion. There is no acute fracture or dislocation identified. There are severe degenerative changes of the elbow with large osteophyte formation and joint space narrowing diffusely. IMPRESSION: 1. Medial soft tissue swelling and joint effusion. No acute fracture or dislocation identified. 2. Severe degenerative changes of the elbow. Electronically Signed   By: Ronney Asters M.D.   On:  08/28/2022 21:14    Procedures Procedures  {Document cardiac monitor, telemetry assessment procedure when appropriate:1}  Medications Ordered in ED Medications - No data to display  ED Course/ Medical Decision Making/ A&P                           Medical Decision Making Amount and/or Complexity of Data Reviewed Radiology: ordered.   ***  {Document critical care time when appropriate:1} {Document review of labs and clinical decision tools ie heart score, Chads2Vasc2 etc:1}  {Document your independent review of radiology images, and any outside records:1} {Document your discussion with family members, caretakers, and with consultants:1} {Document social determinants of health affecting pt's care:1} {Document your decision making why or why not admission, treatments were needed:1} Final Clinical Impression(s) / ED Diagnoses Final diagnoses:  None    Rx / DC Orders ED Discharge Orders     None

## 2022-08-28 NOTE — ED Triage Notes (Signed)
R elbow pain following a fall Saturday evening. Worse and move tender since then with reduced ROM.  Has taken tylenol, diclofenac and iced the area.  Pt is diabetic.

## 2022-08-29 DIAGNOSIS — S5001XA Contusion of right elbow, initial encounter: Secondary | ICD-10-CM | POA: Diagnosis not present

## 2022-08-29 MED ORDER — HYDROCODONE-ACETAMINOPHEN 5-325 MG PO TABS
1.0000 | ORAL_TABLET | Freq: Once | ORAL | Status: AC
  Administered 2022-08-29: 1 via ORAL
  Filled 2022-08-29: qty 1

## 2023-06-18 IMAGING — DX DG FOOT COMPLETE 3+V*L*
3 series · 3 of 3 positions shown · non-contrast
Comparison: None.

CLINICAL DATA: Stepped on a nail.  Heel puncture.

EXAM:
LEFT FOOT - COMPLETE 3+ VIEW

[foot ap]
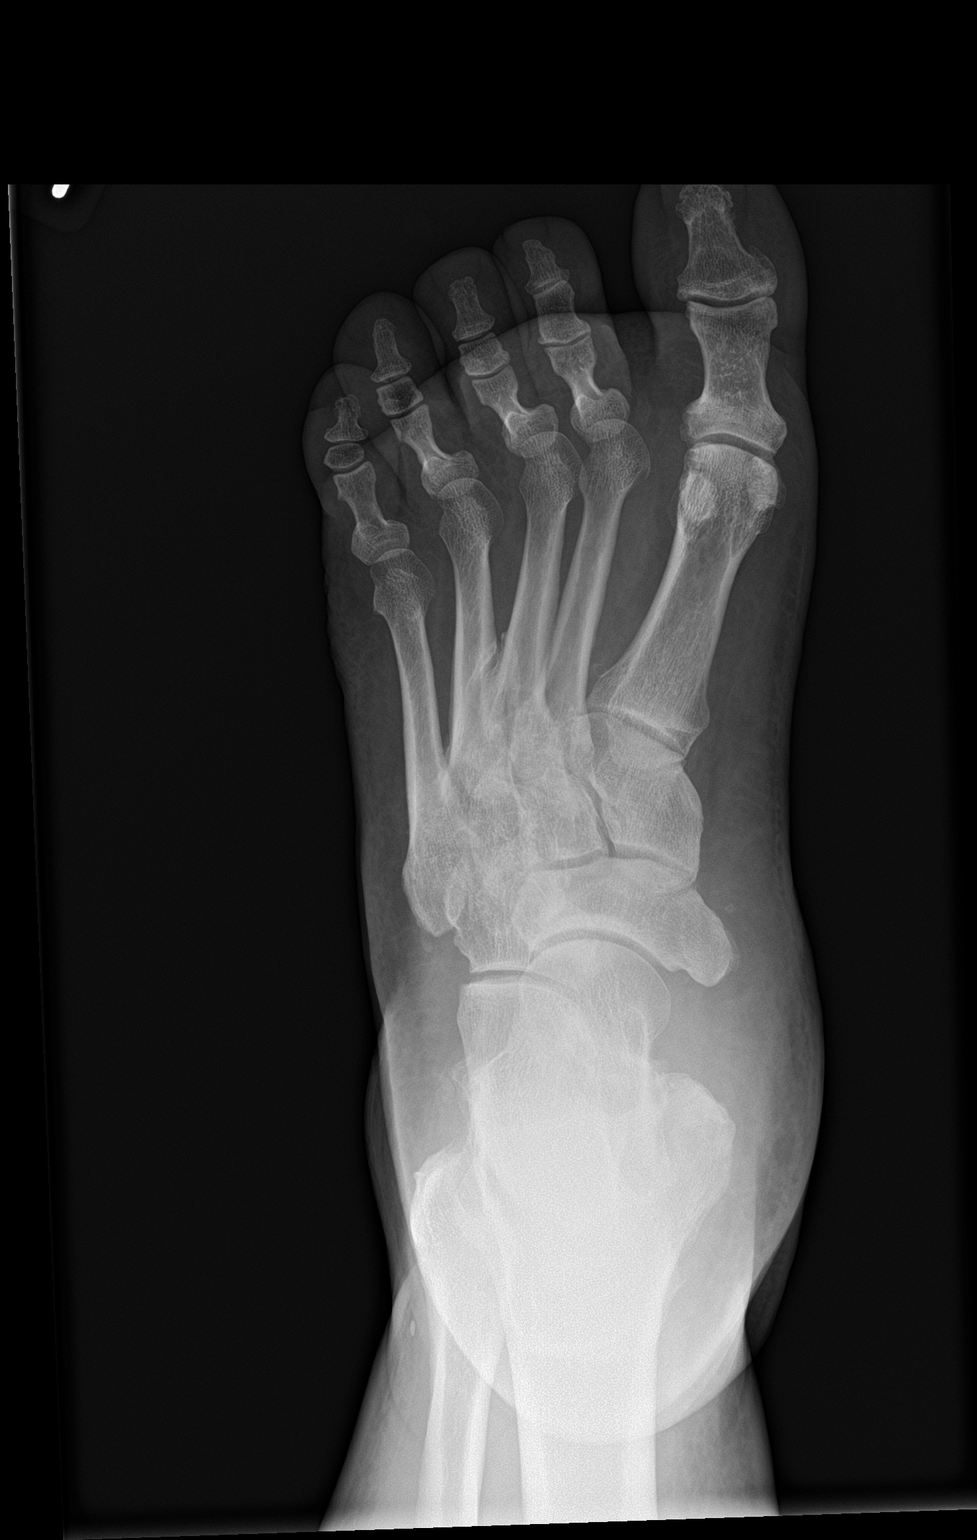

[foot obl]
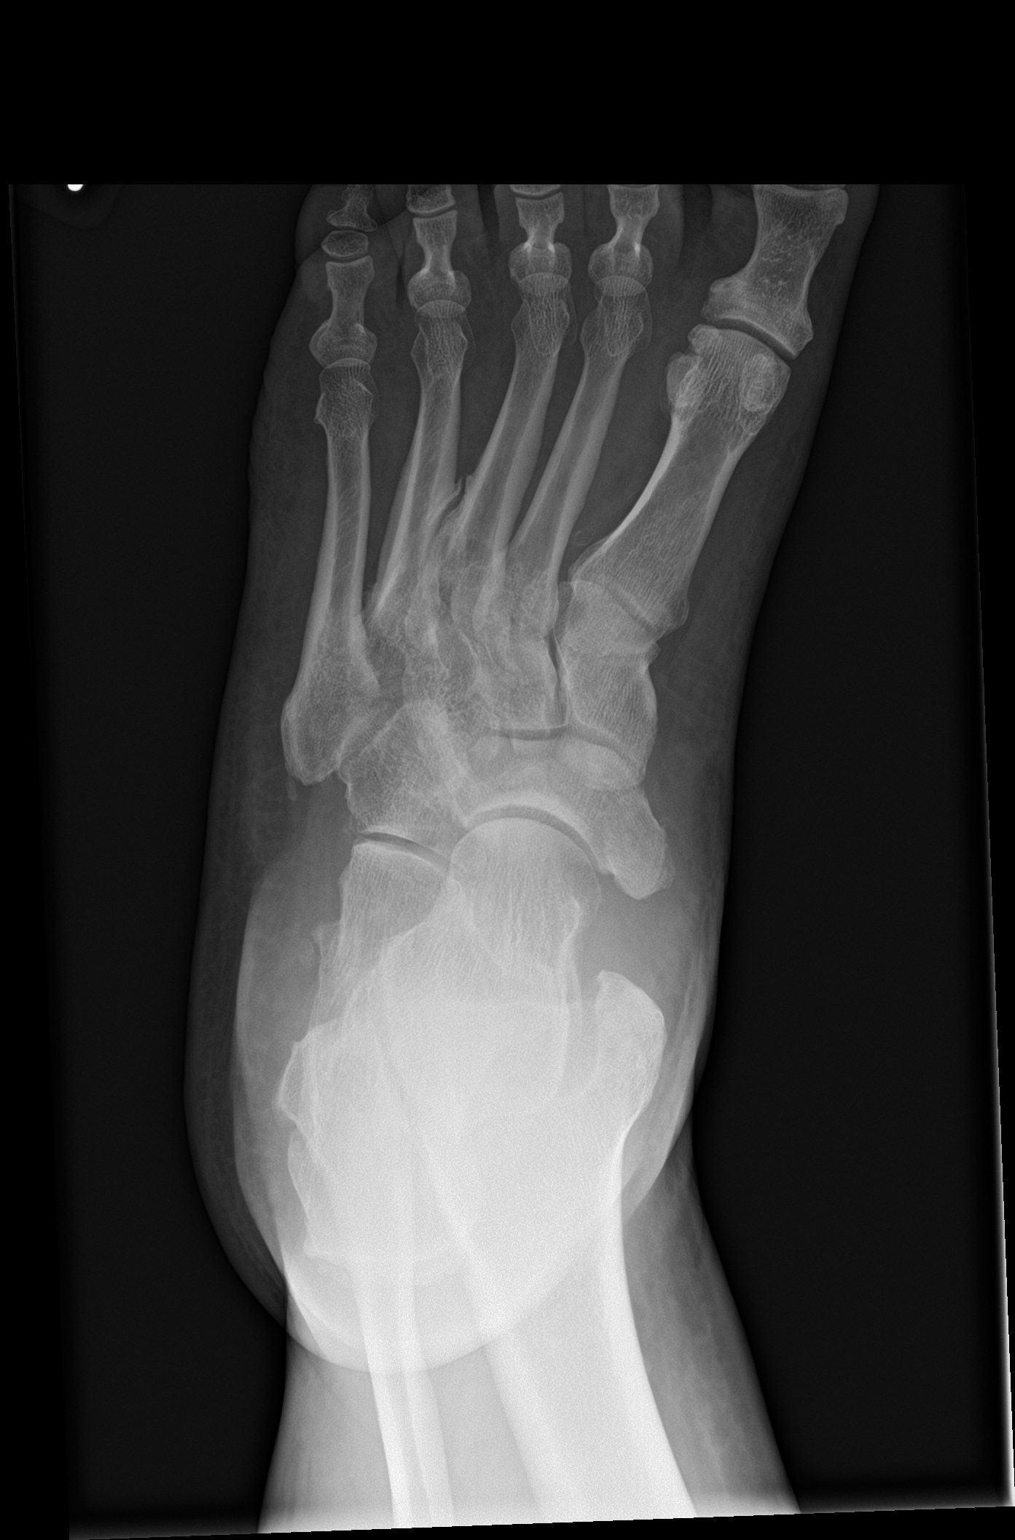

[foot lat]
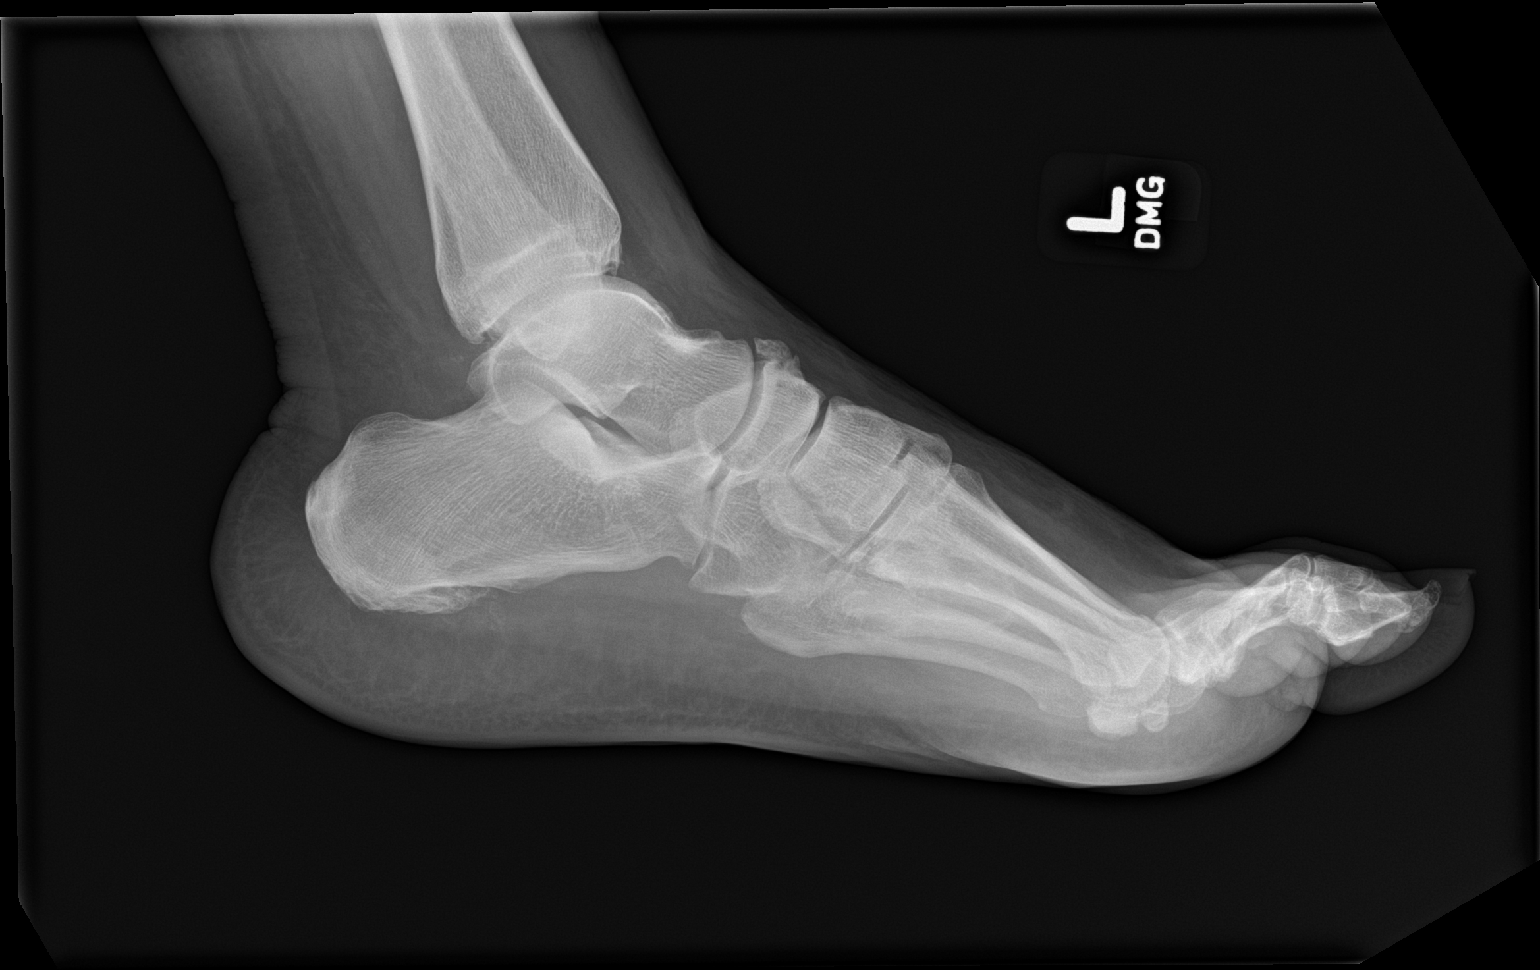

[3 of 3 positions shown; findings below may reference images not displayed]

FINDINGS: No soft tissue gas or metallic foreign body. No acute fracture or
dislocation. Intermetatarsal spurring between the third and fourth
rays.
IMPRESSION: No metallic foreign body or fracture.
